# Patient Record
Sex: Female | Born: 1965 | Race: Black or African American | Hispanic: No | Marital: Single
Health system: Southern US, Community
[De-identification: ages and names within clinical notes are randomized; demographics above are authoritative.]

---

## 1999-12-25 ENCOUNTER — Other Ambulatory Visit: Admission: RE | Admit: 1999-12-25 | Discharge: 1999-12-25 | Payer: Self-pay | Admitting: *Deleted

## 2001-12-16 ENCOUNTER — Other Ambulatory Visit: Admission: RE | Admit: 2001-12-16 | Discharge: 2001-12-16 | Payer: Self-pay | Admitting: Obstetrics and Gynecology

## 2002-12-29 ENCOUNTER — Other Ambulatory Visit: Admission: RE | Admit: 2002-12-29 | Discharge: 2002-12-29 | Payer: Self-pay | Admitting: Obstetrics and Gynecology

## 2003-02-17 ENCOUNTER — Encounter: Payer: Self-pay | Admitting: Gastroenterology

## 2003-02-17 ENCOUNTER — Encounter: Admission: RE | Admit: 2003-02-17 | Discharge: 2003-02-17 | Payer: Self-pay | Admitting: Gastroenterology

## 2003-02-22 ENCOUNTER — Ambulatory Visit (HOSPITAL_COMMUNITY): Admission: RE | Admit: 2003-02-22 | Discharge: 2003-02-22 | Payer: Self-pay | Admitting: Gastroenterology

## 2003-02-22 ENCOUNTER — Encounter: Payer: Self-pay | Admitting: Gastroenterology

## 2003-05-16 ENCOUNTER — Ambulatory Visit (HOSPITAL_COMMUNITY): Admission: RE | Admit: 2003-05-16 | Discharge: 2003-05-17 | Payer: Self-pay | Admitting: Surgery

## 2003-05-16 ENCOUNTER — Encounter: Payer: Self-pay | Admitting: Surgery

## 2003-05-16 ENCOUNTER — Encounter (INDEPENDENT_AMBULATORY_CARE_PROVIDER_SITE_OTHER): Payer: Self-pay | Admitting: *Deleted

## 2004-12-24 ENCOUNTER — Encounter: Admission: RE | Admit: 2004-12-24 | Discharge: 2004-12-24 | Payer: Self-pay | Admitting: Internal Medicine

## 2010-09-16 ENCOUNTER — Encounter: Payer: Self-pay | Admitting: Obstetrics & Gynecology

## 2011-01-11 NOTE — Op Note (Signed)
NAME:  Anna Hays, Anna Hays                        ACCOUNT NO.:  [id number]   MEDICAL RECORD NO.:  [id number]                   PATIENT TYPE:  OIB   LOCATION:  5703                                 FACILITY:  [facility]   PHYSICIAN:  Abigail Miyamoto, M.D.              DATE OF BIRTH:  August 31, 1965   DATE OF PROCEDURE:  05/16/2003  DATE OF DISCHARGE:                                 OPERATIVE REPORT   PREOPERATIVE DIAGNOSIS:  Biliary dyskinesia.   POSTOPERATIVE DIAGNOSIS:  Biliary dyskinesia.   OPERATION PERFORMED:  Laparoscopic cholecystectomy with intraoperative  cholangiogram.   SURGEON:  Douglas A. Magnus Ivan, M.D.   ASSISTANT:  Ollen Gross. Carolynne Edouard, M.D.   ANESTHESIA:  General endotracheal.   ESTIMATED BLOOD LOSS:  Minimal.   OPERATIVE FINDINGS:  The patient was found to have a normal cholangiogram.  Her gallbladder did have a chronic scarred appearance with __________ amount  of adhesions of omentum stuck to the gallbladder.   DESCRIPTION OF PROCEDURE:  The patient was brought to the operating room and  identified as Anna Hays.  She was placed supine on the operating table  and general anesthesia was induced.  Her abdomen was then prepped and draped  in the usual sterile fashion.  Using a #15 blade a small transverse incision  was made below the umbilicus.  The incision was carried down through the  fascia which was then opened with the scalpel.  A hemostat was then used to  divide through the peritoneal cavity.  Next, a 0 Vicryl pursestring suture  was placed around the fascial opening.  The Hasson port was placed through  the opening and insufflation of the abdomen was begun.  Next, a 12 mm port  was placed in the patient's epigastrium and two 5 mm ports were placed in  the patient's right flank under direct vision.  The gallbladder was then  retracted above the liver bed.  Dissection was then carried out around the  gallbladder.  The patient was found to have multiple adhesions to  the  gallbladder which were taken down both bluntly and with the electrocautery.  The cystic duct was finally identified and then clipped once distally and  partly transected with laparoscopic scissors.  An angiocatheter was then  inserted under direct vision in the right upper quadrant.  The  cholangiocatheter was passed through the angiocatheter and placed into the  opening in the cystic duct.  A cholangiogram was then performed with  contrast under direct fluoroscopy.  Good flow of contrast was seen into the  entire biliary system and duodenum without evidence of abnormality.  The  cholangiocatheter was then removed.  The cystic duct was then clipped three  times proximally and transected with scissors.  The cystic artery and the  posterior branch were identified and clipped proximally and distally and  transected as well.  The gallbladder was then slowly dissected free from the  liver bed  with the electrocautery. Once the gallbladder was freed from the  liver bed, the liver bed was examined and hemostasis was achieved with the  cautery.  The gallbladder was then removed through the incision at the  umbilicus.  The 0 Vicryl at the umbilicus was tied in place closing the  fascial defect.  The abdomen was then irrigated with normal saline.  Again,  hemostasis appeared to be achieved.  All ports were then removed under  direct vision and the abdomen was deflated.  All incisions were then  anesthetized with 0.25% Marcaine and closed with 4-0 Vicryl subcuticular  sutures.  Steri-Strips gauze and tape were then  applied.  The patient tolerated the procedure well.  All sponge, needle and  instrument counts were correct at the end of the procedure.  The patient was  then extubated in the operating room and taken in stable condition to the  recovery room.                                               Abigail Miyamoto, M.D.    DB/MEDQ  D:  05/16/2003  T:  05/16/2003  Job:  [id number]

## 2011-05-28 ENCOUNTER — Other Ambulatory Visit: Payer: Self-pay | Admitting: Internal Medicine

## 2011-05-28 ENCOUNTER — Other Ambulatory Visit: Payer: Self-pay | Admitting: *Deleted

## 2011-05-28 DIAGNOSIS — Z1231 Encounter for screening mammogram for malignant neoplasm of breast: Secondary | ICD-10-CM

## 2011-06-11 ENCOUNTER — Ambulatory Visit
Admission: RE | Admit: 2011-06-11 | Discharge: 2011-06-11 | Disposition: A | Discharge status: Home / Self Care | Payer: 59 | Source: Ambulatory Visit | Attending: *Deleted | Admitting: *Deleted

## 2011-06-11 DIAGNOSIS — Z1231 Encounter for screening mammogram for malignant neoplasm of breast: Secondary | ICD-10-CM

## 2012-06-02 ENCOUNTER — Other Ambulatory Visit: Payer: Self-pay | Admitting: Obstetrics and Gynecology

## 2012-06-02 DIAGNOSIS — Z1231 Encounter for screening mammogram for malignant neoplasm of breast: Secondary | ICD-10-CM

## 2012-06-16 ENCOUNTER — Ambulatory Visit (INDEPENDENT_AMBULATORY_CARE_PROVIDER_SITE_OTHER): Payer: 59

## 2012-06-16 DIAGNOSIS — Z1231 Encounter for screening mammogram for malignant neoplasm of breast: Secondary | ICD-10-CM

## 2012-06-16 DIAGNOSIS — R928 Other abnormal and inconclusive findings on diagnostic imaging of breast: Secondary | ICD-10-CM

## 2012-06-22 ENCOUNTER — Other Ambulatory Visit: Payer: Self-pay | Admitting: Obstetrics and Gynecology

## 2012-06-22 DIAGNOSIS — R928 Other abnormal and inconclusive findings on diagnostic imaging of breast: Secondary | ICD-10-CM

## 2012-07-02 ENCOUNTER — Ambulatory Visit
Admission: RE | Admit: 2012-07-02 | Discharge: 2012-07-02 | Disposition: A | Discharge status: Home / Self Care | Payer: 59 | Source: Ambulatory Visit | Attending: Obstetrics and Gynecology | Admitting: Obstetrics and Gynecology

## 2012-07-02 DIAGNOSIS — R928 Other abnormal and inconclusive findings on diagnostic imaging of breast: Secondary | ICD-10-CM

## 2013-06-09 ENCOUNTER — Other Ambulatory Visit: Payer: Self-pay

## 2013-06-09 DIAGNOSIS — Z1231 Encounter for screening mammogram for malignant neoplasm of breast: Secondary | ICD-10-CM

## 2013-07-06 ENCOUNTER — Ambulatory Visit
Admission: RE | Admit: 2013-07-06 | Discharge: 2013-07-06 | Disposition: A | Discharge status: Home / Self Care | Payer: 59 | Source: Ambulatory Visit

## 2013-07-06 DIAGNOSIS — Z1231 Encounter for screening mammogram for malignant neoplasm of breast: Secondary | ICD-10-CM

## 2013-07-09 ENCOUNTER — Other Ambulatory Visit: Payer: Self-pay | Admitting: Obstetrics and Gynecology

## 2013-07-09 DIAGNOSIS — R928 Other abnormal and inconclusive findings on diagnostic imaging of breast: Secondary | ICD-10-CM

## 2013-07-29 ENCOUNTER — Ambulatory Visit
Admission: RE | Admit: 2013-07-29 | Discharge: 2013-07-29 | Disposition: A | Discharge status: Home / Self Care | Payer: 59 | Source: Ambulatory Visit | Attending: Obstetrics and Gynecology | Admitting: Obstetrics and Gynecology

## 2013-07-29 DIAGNOSIS — R928 Other abnormal and inconclusive findings on diagnostic imaging of breast: Secondary | ICD-10-CM

## 2014-11-02 ENCOUNTER — Other Ambulatory Visit: Payer: Self-pay

## 2014-11-02 DIAGNOSIS — Z1231 Encounter for screening mammogram for malignant neoplasm of breast: Secondary | ICD-10-CM

## 2014-11-04 ENCOUNTER — Ambulatory Visit
Admission: RE | Admit: 2014-11-04 | Discharge: 2014-11-04 | Disposition: A | Discharge status: Home / Self Care | Payer: 59 | Source: Ambulatory Visit

## 2014-11-04 ENCOUNTER — Encounter (INDEPENDENT_AMBULATORY_CARE_PROVIDER_SITE_OTHER): Payer: Self-pay

## 2014-11-04 DIAGNOSIS — Z1231 Encounter for screening mammogram for malignant neoplasm of breast: Secondary | ICD-10-CM

## 2015-11-01 ENCOUNTER — Other Ambulatory Visit: Payer: Self-pay

## 2015-11-01 DIAGNOSIS — Z1231 Encounter for screening mammogram for malignant neoplasm of breast: Secondary | ICD-10-CM

## 2015-11-16 ENCOUNTER — Ambulatory Visit
Admission: RE | Admit: 2015-11-16 | Discharge: 2015-11-16 | Disposition: A | Discharge status: Home / Self Care | Payer: 59 | Source: Ambulatory Visit

## 2015-11-16 DIAGNOSIS — Z1231 Encounter for screening mammogram for malignant neoplasm of breast: Secondary | ICD-10-CM

## 2016-10-07 ENCOUNTER — Ambulatory Visit (INDEPENDENT_AMBULATORY_CARE_PROVIDER_SITE_OTHER): Payer: 59 | Admitting: Orthopaedic Surgery

## 2016-10-07 ENCOUNTER — Encounter (INDEPENDENT_AMBULATORY_CARE_PROVIDER_SITE_OTHER): Payer: Self-pay

## 2016-10-07 DIAGNOSIS — M654 Radial styloid tenosynovitis [de Quervain]: Secondary | ICD-10-CM

## 2016-10-07 MED ORDER — DICLOFENAC SODIUM 1 % TD GEL: 2.0000 g | Freq: Four times a day (QID) | TRANSDERMAL | 3 refills | Status: AC

## 2016-10-07 MED ORDER — METHYLPREDNISOLONE 4 MG PO TABS: ORAL_TABLET | ORAL | 0 refills | Status: AC

## 2016-10-07 NOTE — Progress Notes (Signed)
   Office Visit Note   Patient: Anna Hays           Date of Birth: Jan 14, 1966           MRN: [id number] Visit Date: 10/07/2016              Requested by: No referring provider defined for this encounter. PCP: No primary care provider on file.   Assessment & Plan: Visit Diagnoses:  1. De Quervain's disease (tenosynovitis)     Plan: I will try combination of activity modification, a Velcro thumb spica wrist splint, a steroid taper, and a topical anti-inflammatory and see if this will help calm her symptoms down. I'll see her back in about 2 weeks and things did not improve consider a steroid injection over the first dorsal compartment.  Follow-Up Instructions: Return in about 2 weeks (around 10/21/2016).   Orders:  No orders of the defined types were placed in this encounter.  Meds ordered this encounter  Medications  . methylPREDNISolone (MEDROL) 4 MG tablet    Sig: Medrol dose pack. Take as instructed    Dispense:  21 tablet    Refill:  0  . diclofenac sodium (VOLTAREN) 1 % GEL    Sig: Apply 2 g topically 4 (four) times daily.    Dispense:  100 g    Refill:  3      Procedures: No procedures performed   Clinical Data: No additional findings.   Subjective: No chief complaint on file. She comes in with a chief complaint of right dominant wrist pain and she points over the thumb and the first dorsal compartment source of her pain. For about 3 weeks now. It is painful to Grip things and rotator wrist. She denies any numbness and tingling. 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  HPI  Review of Systems She denies any recent illnesses and denies any chest pain, headache, shortness of breath, fever, chills, nausea, vomiting.  Objective: Vital Signs: There were no vitals taken for this visit.  Physical Exam She is alert and oriented 3 and in no acute distress Ortho Exam Examination of her right wrist shows no numbness and tingling. She has a positive  Finkelstein test. The pain is over the first dorsal compartment only. She is neurovascularly intact. Specialty Comments:  No specialty comments available.  Imaging: No results found.   PMFS History: Patient Active Problem List   Diagnosis Date Noted  . De Quervain's disease (tenosynovitis) 10/07/2016   No past medical history on file.  No family history on file.  No past surgical history on file. Social History   Occupational History  . Not on file.   Social History Main Topics  . Smoking status: Not on file  . Smokeless tobacco: Not on file  . Alcohol use Not on file  . Drug use: Unknown  . Sexual activity: Not on file

## 2016-10-23 ENCOUNTER — Ambulatory Visit (INDEPENDENT_AMBULATORY_CARE_PROVIDER_SITE_OTHER): Payer: 59 | Admitting: Orthopaedic Surgery

## 2016-10-23 DIAGNOSIS — M654 Radial styloid tenosynovitis [de Quervain]: Secondary | ICD-10-CM | POA: Diagnosis not present

## 2016-10-23 NOTE — Progress Notes (Signed)
The patient is following up after treatment for de Quervain's tenosynovitis of her right wrist. Between activity modification, anti-inflammatories, and a wrist splint she has done well. She states she still has pain but it is getting a lot better. We never got an injection in this area. She says right now she does not need an injection.  On examination of her right wrist she has full range of motion of her wrist. She still has a positive Finkelstein test and pain over the first dorsal compartment of the right wrist however does much less compared to her last visit.  At this point she'll follow-up as needed. I've told her she can always come from the injection if it worsens on her in any way.

## 2016-11-25 ENCOUNTER — Other Ambulatory Visit: Payer: Self-pay | Admitting: Obstetrics and Gynecology

## 2016-11-25 DIAGNOSIS — Z1231 Encounter for screening mammogram for malignant neoplasm of breast: Secondary | ICD-10-CM

## 2016-12-12 ENCOUNTER — Ambulatory Visit
Admission: RE | Admit: 2016-12-12 | Discharge: 2016-12-12 | Disposition: A | Discharge status: Home / Self Care | Payer: 59 | Source: Ambulatory Visit | Attending: Obstetrics and Gynecology | Admitting: Obstetrics and Gynecology

## 2016-12-12 DIAGNOSIS — Z1231 Encounter for screening mammogram for malignant neoplasm of breast: Secondary | ICD-10-CM

## 2017-11-26 ENCOUNTER — Other Ambulatory Visit: Payer: Self-pay | Admitting: Obstetrics and Gynecology

## 2017-11-26 DIAGNOSIS — Z1231 Encounter for screening mammogram for malignant neoplasm of breast: Secondary | ICD-10-CM

## 2017-12-16 ENCOUNTER — Ambulatory Visit
Admission: RE | Admit: 2017-12-16 | Discharge: 2017-12-16 | Disposition: A | Discharge status: Home / Self Care | Payer: 59 | Source: Ambulatory Visit | Attending: Obstetrics and Gynecology | Admitting: Obstetrics and Gynecology

## 2017-12-16 DIAGNOSIS — Z1231 Encounter for screening mammogram for malignant neoplasm of breast: Secondary | ICD-10-CM

## 2018-08-05 DIAGNOSIS — D259 Leiomyoma of uterus, unspecified: Secondary | ICD-10-CM | POA: Insufficient documentation

## 2018-08-05 DIAGNOSIS — E663 Overweight: Secondary | ICD-10-CM | POA: Insufficient documentation

## 2018-08-05 DIAGNOSIS — E119 Type 2 diabetes mellitus without complications: Secondary | ICD-10-CM | POA: Insufficient documentation

## 2019-03-11 DIAGNOSIS — K2100 Gastro-esophageal reflux disease with esophagitis, without bleeding: Secondary | ICD-10-CM | POA: Insufficient documentation

## 2019-03-11 DIAGNOSIS — I10 Essential (primary) hypertension: Secondary | ICD-10-CM | POA: Insufficient documentation

## 2019-03-11 DIAGNOSIS — E782 Mixed hyperlipidemia: Secondary | ICD-10-CM | POA: Insufficient documentation

## 2019-04-19 ENCOUNTER — Other Ambulatory Visit: Payer: Self-pay | Admitting: Family Medicine

## 2019-04-19 DIAGNOSIS — Z1231 Encounter for screening mammogram for malignant neoplasm of breast: Secondary | ICD-10-CM

## 2019-05-11 ENCOUNTER — Other Ambulatory Visit: Payer: Self-pay

## 2019-05-11 ENCOUNTER — Ambulatory Visit
Admission: RE | Admit: 2019-05-11 | Discharge: 2019-05-11 | Disposition: A | Discharge status: Home / Self Care | Payer: 59 | Source: Ambulatory Visit | Attending: Family Medicine | Admitting: Family Medicine

## 2019-05-11 DIAGNOSIS — Z1231 Encounter for screening mammogram for malignant neoplasm of breast: Secondary | ICD-10-CM

## 2019-06-15 DIAGNOSIS — R718 Other abnormality of red blood cells: Secondary | ICD-10-CM | POA: Insufficient documentation

## 2019-06-15 DIAGNOSIS — D649 Anemia, unspecified: Secondary | ICD-10-CM | POA: Insufficient documentation

## 2019-06-15 DIAGNOSIS — Z8639 Personal history of other endocrine, nutritional and metabolic disease: Secondary | ICD-10-CM | POA: Insufficient documentation

## 2020-01-19 DIAGNOSIS — E559 Vitamin D deficiency, unspecified: Secondary | ICD-10-CM | POA: Insufficient documentation

## 2020-04-17 ENCOUNTER — Other Ambulatory Visit: Payer: Self-pay | Admitting: Family Medicine

## 2020-04-17 DIAGNOSIS — Z1231 Encounter for screening mammogram for malignant neoplasm of breast: Secondary | ICD-10-CM

## 2020-05-12 ENCOUNTER — Ambulatory Visit
Admission: RE | Admit: 2020-05-12 | Discharge: 2020-05-12 | Disposition: A | Discharge status: Home / Self Care | Payer: 59 | Source: Ambulatory Visit | Attending: Family Medicine | Admitting: Family Medicine

## 2020-05-12 ENCOUNTER — Other Ambulatory Visit: Payer: Self-pay

## 2020-05-12 DIAGNOSIS — Z1231 Encounter for screening mammogram for malignant neoplasm of breast: Secondary | ICD-10-CM

## 2020-06-20 DIAGNOSIS — Z8041 Family history of malignant neoplasm of ovary: Secondary | ICD-10-CM | POA: Insufficient documentation

## 2020-10-31 ENCOUNTER — Encounter: Payer: Self-pay | Admitting: Podiatry

## 2020-10-31 ENCOUNTER — Ambulatory Visit (INDEPENDENT_AMBULATORY_CARE_PROVIDER_SITE_OTHER): Payer: 59 | Admitting: Podiatry

## 2020-10-31 ENCOUNTER — Other Ambulatory Visit: Payer: Self-pay

## 2020-10-31 DIAGNOSIS — S90212A Contusion of left great toe with damage to nail, initial encounter: Secondary | ICD-10-CM | POA: Diagnosis not present

## 2020-10-31 NOTE — Progress Notes (Signed)
  Subjective:  Patient ID: Anna Hays, female    DOB: Jan 06, 1966,  MRN: [id number]  Chief Complaint  Patient presents with  . Nail Problem    Left hallux nail has a blood spot under the nail Pt stated that it has been there for about a month    55 y.o. female presents with the above complaint. History confirmed with patient.  Thinks it started after she did a lot of walking.  Is not painful.  She mainly wanted to get it checked, her mother had diabetes and getting an infection and and amputation.  Objective:  Physical Exam: warm, good capillary refill, no trophic changes or ulcerative lesions, normal DP and PT pulses and normal sensory exam. Left Foot: Late resolving subungual hematoma in the midportion of the lateral hallux nail, there is a ridge with Beau's line Assessment:   1. Subungual hematoma of great toe of left foot, initial encounter      Plan:  Patient was evaluated and treated and all questions answered.  Discussed with her that this is likely due to subacute microtrauma from shoe gear.  We discussed different types of shoe gear that would keep pressure off this.  Do not think that the nail needs partial or full avulsion at this time, I think this will resolve uneventfully for her in a few months as it grows out.  Advised her if becomes loose or signs of infection develop that she should come back for an avulsion.   No follow-ups on file.

## 2021-05-02 ENCOUNTER — Other Ambulatory Visit: Payer: Self-pay | Admitting: Internal Medicine

## 2021-05-02 DIAGNOSIS — Z1231 Encounter for screening mammogram for malignant neoplasm of breast: Secondary | ICD-10-CM

## 2021-05-15 ENCOUNTER — Other Ambulatory Visit: Payer: Self-pay

## 2021-05-15 ENCOUNTER — Ambulatory Visit
Admission: RE | Admit: 2021-05-15 | Discharge: 2021-05-15 | Disposition: A | Discharge status: Home / Self Care | Payer: No Typology Code available for payment source | Source: Ambulatory Visit | Attending: Internal Medicine | Admitting: Internal Medicine

## 2021-05-15 DIAGNOSIS — Z1231 Encounter for screening mammogram for malignant neoplasm of breast: Secondary | ICD-10-CM

## 2022-05-03 ENCOUNTER — Other Ambulatory Visit: Payer: Self-pay | Admitting: Internal Medicine

## 2022-05-03 DIAGNOSIS — Z1231 Encounter for screening mammogram for malignant neoplasm of breast: Secondary | ICD-10-CM

## 2022-05-21 ENCOUNTER — Ambulatory Visit
Admission: RE | Admit: 2022-05-21 | Discharge: 2022-05-21 | Disposition: A | Discharge status: Home / Self Care | Payer: No Typology Code available for payment source | Source: Ambulatory Visit | Attending: Internal Medicine | Admitting: Internal Medicine

## 2022-05-21 DIAGNOSIS — Z1231 Encounter for screening mammogram for malignant neoplasm of breast: Secondary | ICD-10-CM

## 2022-11-13 IMAGING — MG MM DIGITAL SCREENING BILAT W/ TOMO AND CAD
8 series · 8 of 24 positions shown · non-contrast
Comparison: Previous exam(s).

CLINICAL DATA: Screening.

EXAM:
DIGITAL SCREENING BILATERAL MAMMOGRAM WITH TOMOSYNTHESIS AND CAD
TECHNIQUE: Bilateral screening digital craniocaudal and mediolateral oblique
mammograms were obtained. Bilateral screening digital breast
tomosynthesis was performed. The images were evaluated with
computer-aided detection.

[R MLO synth-2D]
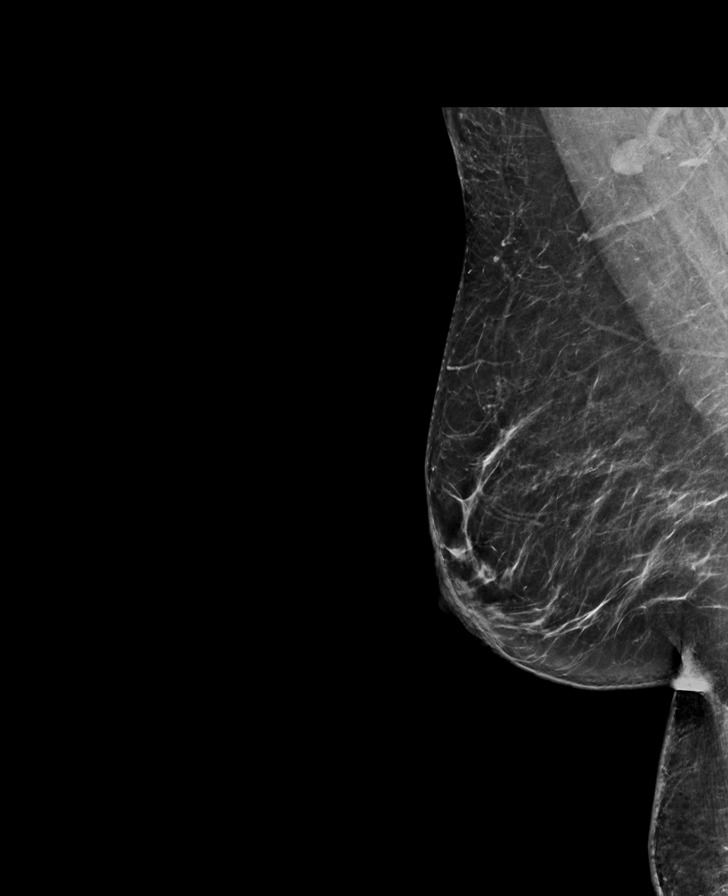

[L CC synth-2D]
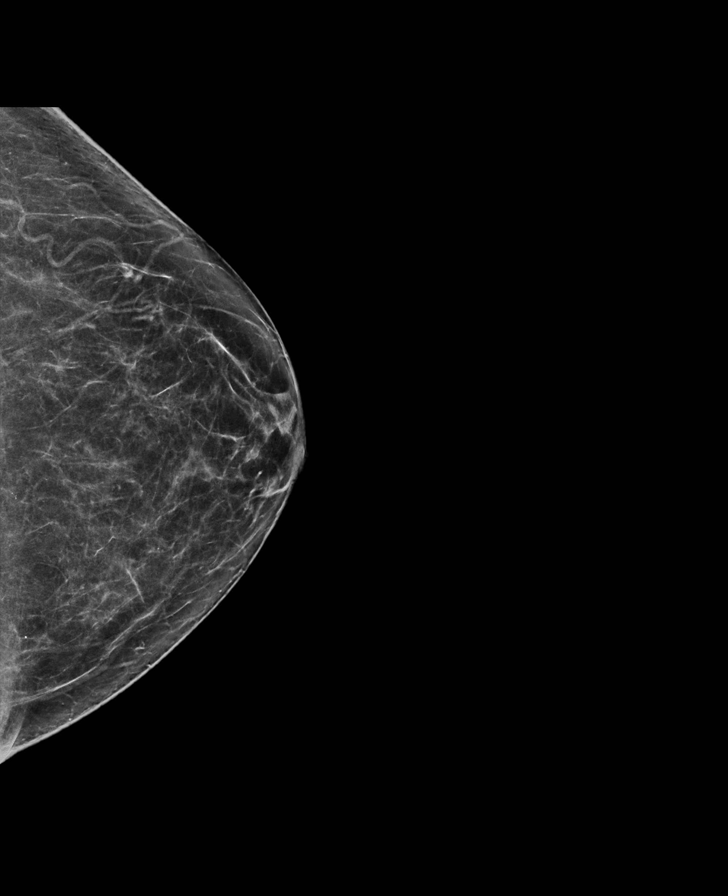

[L MLO synth-2D]
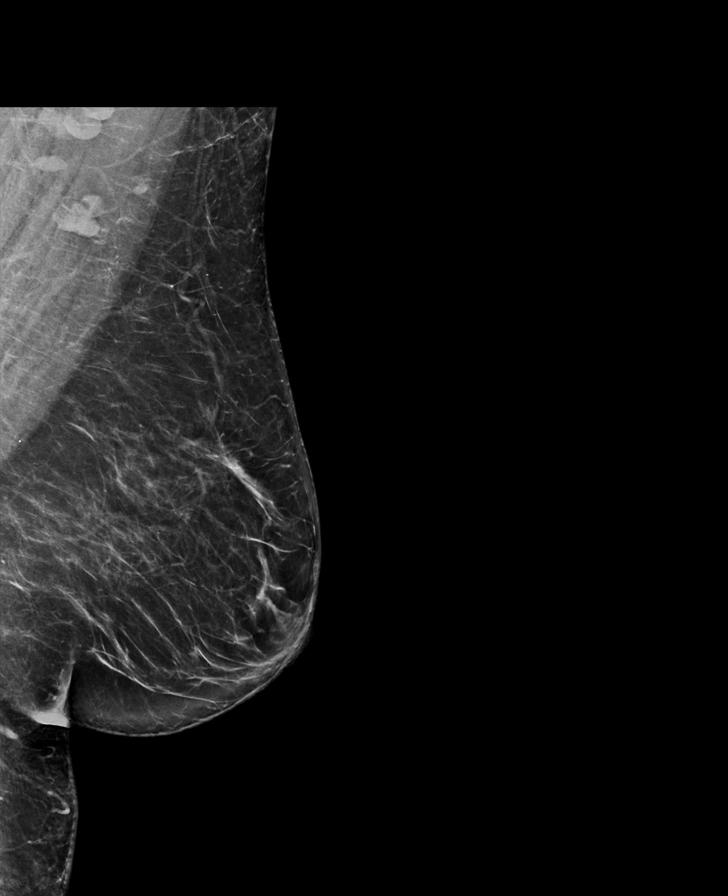

[R CC synth-2D]
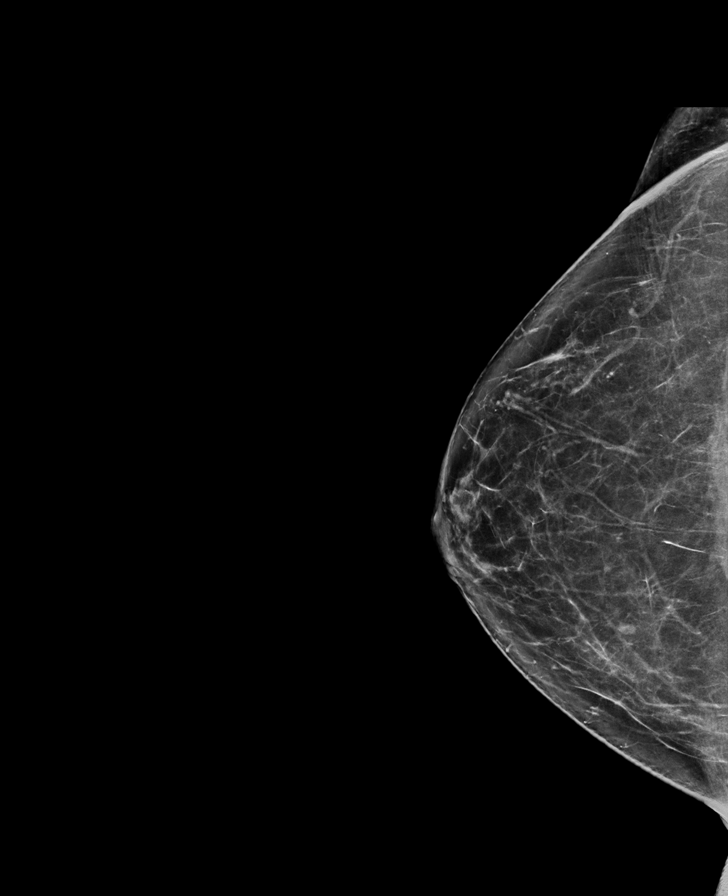

[R MLO tomo · tomo slice 41/80.0]
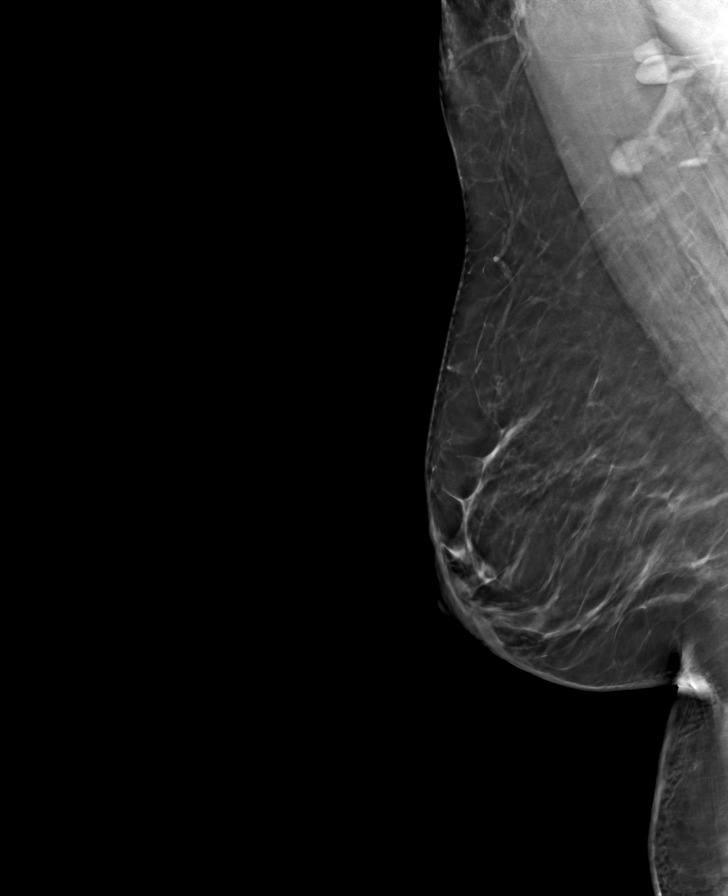

[R CC tomo · tomo slice 35/70.0]
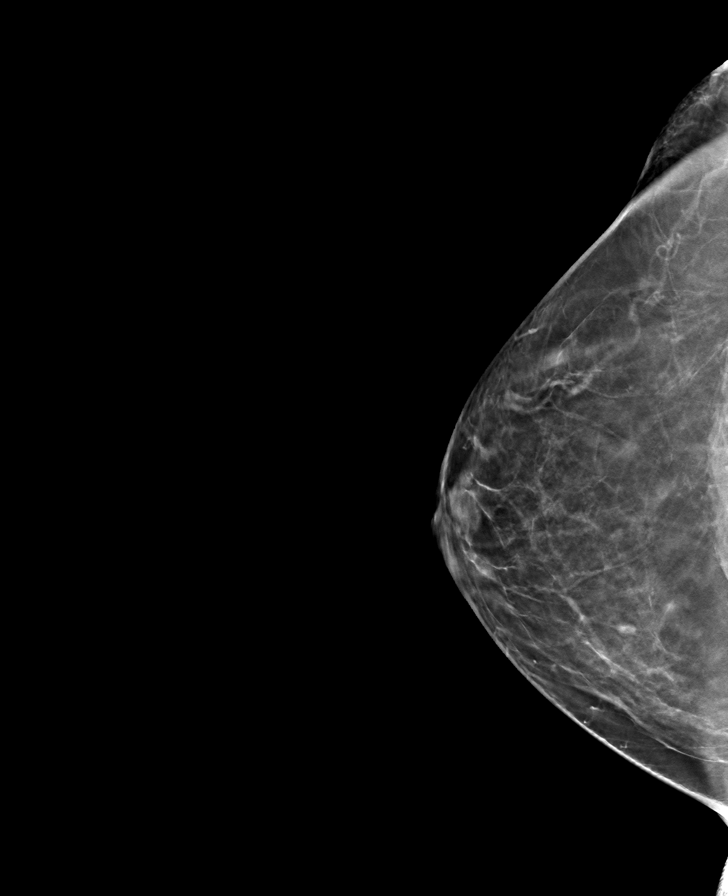

[L MLO tomo · tomo slice 41/80.0]
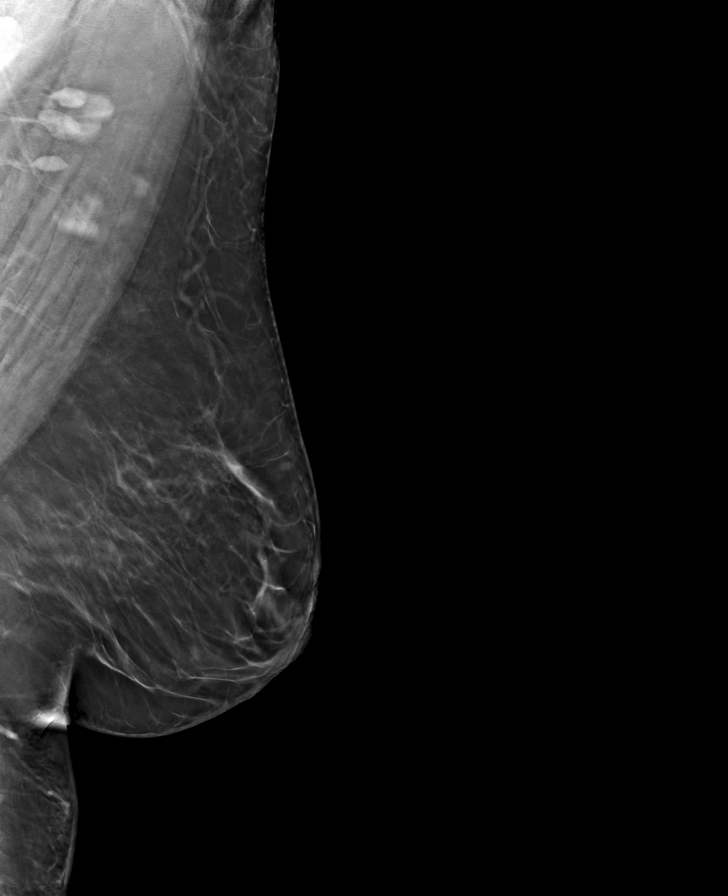

[L CC tomo · tomo slice 33/66.0]
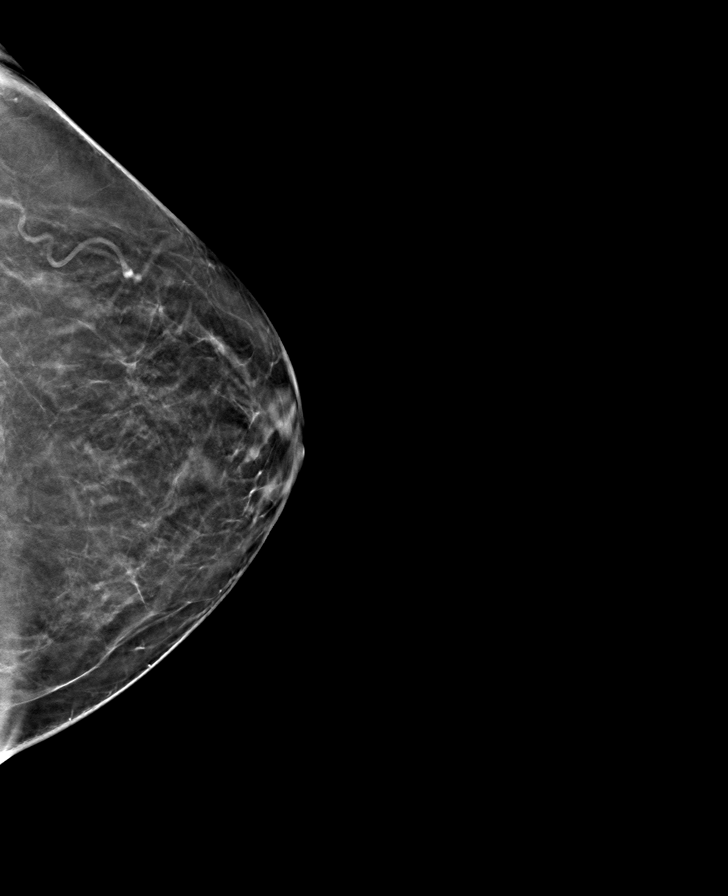

[8 of 24 positions shown; findings below may reference images not displayed]

ACR Breast Density Category b: There are scattered areas of
fibroglandular density.
FINDINGS: There are no findings suspicious for malignancy.
IMPRESSION: No mammographic evidence of malignancy. A result letter of this
screening mammogram will be mailed directly to the patient.

RECOMMENDATION:
Screening mammogram in one year. (Code:51-O-LD2)

BI-RADS CATEGORY  1: Negative.

## 2023-04-16 ENCOUNTER — Other Ambulatory Visit: Payer: Self-pay | Admitting: Internal Medicine

## 2023-04-16 DIAGNOSIS — Z1231 Encounter for screening mammogram for malignant neoplasm of breast: Secondary | ICD-10-CM

## 2023-05-23 ENCOUNTER — Ambulatory Visit
Admission: RE | Admit: 2023-05-23 | Discharge: 2023-05-23 | Disposition: A | Discharge status: Home / Self Care | Payer: No Typology Code available for payment source | Source: Ambulatory Visit | Attending: Internal Medicine | Admitting: Internal Medicine

## 2023-05-23 DIAGNOSIS — Z1231 Encounter for screening mammogram for malignant neoplasm of breast: Secondary | ICD-10-CM
